# Patient Record
Sex: Female | Born: 1965 | Race: White | Hispanic: No | Marital: Married | Smoking: Current every day smoker
Health system: Southern US, Community
[De-identification: ages and names within clinical notes are randomized; demographics above are authoritative.]

## PROBLEM LIST (undated history)

## (undated) DIAGNOSIS — E785 Hyperlipidemia, unspecified: Secondary | ICD-10-CM

## (undated) DIAGNOSIS — L639 Alopecia areata, unspecified: Secondary | ICD-10-CM

## (undated) DIAGNOSIS — E119 Type 2 diabetes mellitus without complications: Secondary | ICD-10-CM

## (undated) HISTORY — DX: Type 2 diabetes mellitus without complications: E11.9

## (undated) HISTORY — DX: Hyperlipidemia, unspecified: E78.5

## (undated) HISTORY — DX: Alopecia areata, unspecified: L63.9

---

## 2021-11-21 ENCOUNTER — Encounter (INDEPENDENT_AMBULATORY_CARE_PROVIDER_SITE_OTHER): Payer: Self-pay | Admitting: *Deleted

## 2021-11-27 ENCOUNTER — Other Ambulatory Visit (HOSPITAL_COMMUNITY): Payer: Self-pay | Admitting: Family Medicine

## 2021-11-27 ENCOUNTER — Other Ambulatory Visit: Payer: Self-pay | Admitting: Family Medicine

## 2021-11-27 DIAGNOSIS — R850 Abnormal level of enzymes in specimens from digestive organs and abdominal cavity: Secondary | ICD-10-CM

## 2022-01-04 ENCOUNTER — Encounter (HOSPITAL_COMMUNITY): Payer: Self-pay | Admitting: Radiology

## 2022-01-04 ENCOUNTER — Ambulatory Visit (HOSPITAL_COMMUNITY)
Admission: RE | Admit: 2022-01-04 | Discharge: 2022-01-04 | Disposition: A | Discharge status: Home / Self Care | Payer: Federal, State, Local not specified - PPO | Source: Ambulatory Visit | Attending: Family Medicine | Admitting: Family Medicine

## 2022-01-04 DIAGNOSIS — R850 Abnormal level of enzymes in specimens from digestive organs and abdominal cavity: Secondary | ICD-10-CM | POA: Diagnosis present

## 2022-01-04 LAB — POCT I-STAT CREATININE: Creatinine, Ser: 0.8 mg/dL (ref 0.44–1.00)

## 2022-01-04 MED ORDER — IOHEXOL 300 MG/ML  SOLN
100.0000 mL | Freq: Once | INTRAMUSCULAR | Status: AC | PRN
  Administered 2022-01-04: 100 mL via INTRAVENOUS

## 2022-01-29 ENCOUNTER — Ambulatory Visit (INDEPENDENT_AMBULATORY_CARE_PROVIDER_SITE_OTHER): Payer: Federal, State, Local not specified - PPO | Admitting: Gastroenterology

## 2022-01-29 ENCOUNTER — Encounter (INDEPENDENT_AMBULATORY_CARE_PROVIDER_SITE_OTHER): Payer: Self-pay | Admitting: Gastroenterology

## 2022-01-29 VITALS — BP 97/54 | HR 95 | Temp 98.0°F | Ht 62.0 in | Wt 125.1 lb

## 2022-01-29 DIAGNOSIS — R748 Abnormal levels of other serum enzymes: Secondary | ICD-10-CM

## 2022-01-29 NOTE — Patient Instructions (Addendum)
I discussed your case with Dr. Laural Golden, he recommended We check pancreatic isoenzymes as this will help to determine further why these are elevated, though likely the elevation in your numbers is not of worrisome cause, especially given CT and US imaging are normal. I will be in touch regarding results of these labs. Please let me know if you have any new GI symptoms ?

## 2022-01-29 NOTE — Progress Notes (Signed)
? ?Referring Provider: Emelda Fear, DO ?Primary Care Physician:  Emelda Fear, DO ?Primary GI Physician: New ? ?Chief Complaint  ?Patient presents with  ? New Patient (Initial Visit)  ?  Elevated pancreatic enzyme/[place]  ? ?HPI:   ?Ellen Parrish is a 56 y.o. female with past medical history of HLD, CKD stage 2, Alopecia areata, arthritis, type 2 DM,  ? ?Patient presenting today as a new patient for elevated pancreatic enzymes. ? ?11/15/21 Lipase 103 and Amylase 182, LFTs WNL with T bili 0.4, ALk Phos 95, AST 18, ALT 10 ?Notably amylase was 151 and lipase 91 in January 2023, also with normal LFTs at that time, US done 10/26/21 with slight increased echogenicity of liver, suggestive of hepatic steatosis, no cholelithiasis or cholecystitis. Ct A/P w/contrast on 01/04/22 also without any acute abdominopelvic findings ? ?Patient denies any abdominal pain, nausea or vomiting. States that PCP was checking routine labs and found that amylase and lipase were both elevated. She has no history of pancreatic issues/pancreatitis in the past. She has not started any new medications prior to elevation in her pancreatic enzymes. Denies rectal bleeding or melena. Feels that weight is fairly stable, weighed around 132 lbs 1-2 years ago, so no significant changes in weight overall.  She reports that appetite feels somewhat decreased, however, she tells me she often feels like she does not get full when she is eating and wonders if "the food is going to her stomach." Denies nausea or vomiting. Denies constipation or diarrhea. Has a BM usually atleast once per day, sometimes could be twice a day. States that a1c was checked in February, and was 6.6. is maintained on nexium 6m, she takes this PRN, usually very occasionally as she does not have much issue with GERD symptoms. No dysphagia or odynophagia, no early satiety or bloating. Does not drink alcohol or use NSAIDs. ? ?NSAID use: usually only does tylenol on occasion  ?Social  hx:no etoh, smokes 1/2 PPD ?Fam hx: no crc, liver, or pancreatic cancer ? ?Last Colonoscopy: oct 2016, normal per patient, advised to repeat in 10 years, Dr. FMarya Landry ?Last Endoscopy:never ? ?No past medical history on file. ? ?No current outpatient medications on file.  ? ?No current facility-administered medications for this visit.  ? ? ?Allergies as of 01/29/2022  ? (Not on File)  ? ? ?No family history on file. ? ?Social History  ? ?Socioeconomic History  ? Marital status: Married  ?  Spouse name: Not on file  ? Number of children: Not on file  ? Years of education: Not on file  ? Highest education level: Not on file  ?Occupational History  ? Not on file  ?Tobacco Use  ? Smoking status: Every Day  ?  Packs/day: 0.50  ?  Types: Cigarettes  ? Smokeless tobacco: Never  ?Substance and Sexual Activity  ? Alcohol use: Not Currently  ? Drug use: Never  ? Sexual activity: Not Currently  ?Other Topics Concern  ? Not on file  ?Social History Narrative  ? Not on file  ? ?Social Determinants of Health  ? ?Financial Resource Strain: Not on file  ?Food Insecurity: Not on file  ?Transportation Needs: Not on file  ?Physical Activity: Not on file  ?Stress: Not on file  ?Social Connections: Not on file  ? ?Review of systems ?General: negative for malaise, night sweats, fever, chills, weight loss ?Neck: Negative for lumps, goiter, pain and significant neck swelling ?Resp: Negative for cough, wheezing, dyspnea at rest ?  CV: Negative for chest pain, leg swelling, palpitations, orthopnea ?GI: denies melena, hematochezia, nausea, vomiting, diarrhea, constipation, dysphagia, odyonophagia, early satiety or unintentional weight loss.  ?MSK: Negative for joint pain or swelling, back pain, and muscle pain. ?Derm: Negative for itching or rash ?Psych: Denies depression, anxiety, memory loss, confusion. No homicidal or suicidal ideation.  ?Heme: Negative for prolonged bleeding, bruising easily, and swollen nodes. ?Endocrine: Negative for cold or  heat intolerance, polyuria, polydipsia and goiter. ?Neuro: negative for tremor, gait imbalance, syncope and seizures. ?The remainder of the review of systems is noncontributory. ? ?Physical Exam: ?BP (!) 97/54 (BP Location: Left Arm, Patient Position: Sitting, Cuff Size: Normal)   Pulse 95   Temp 98 ?F (36.7 ?C) (Oral)   Ht _0  (1.575 m)   Wt 125 lb 1.6 oz (56.7 kg)   LMP  (LMP Unknown)   BMI 22.88 kg/m?  ?General:   Alert and oriented. No distress noted. Pleasant and cooperative.  ?Head:  Normocephalic and atraumatic. ?Eyes:  Conjuctiva clear without scleral icterus. ?Mouth:  Oral mucosa pink and moist. Good dentition. No lesions. ?Heart: Normal rate and rhythm, s1 and s2 heart sounds present.  ?Lungs: Clear lung sounds in all lobes. Respirations equal and unlabored. ?Abdomen:  +BS, soft, non-tender and non-distended. No rebound or guarding. No HSM or masses noted. ?Derm: No palmar erythema or jaundice ?Msk:  Symmetrical without gross deformities. Normal posture. ?Extremities:  Without edema. ?Neurologic:  Alert and  oriented x4 ?Psych:  Alert and cooperative. Normal mood and affect. ? ?Invalid input(s): 6 MONTHS  ? ?ASSESSMENT: ?Ellen Parrish is a 56 y.o. female presenting today as a new patient for elevated amylase and lipase. ? ?No abdominal pain, nausea or vomiting. No  CT or [place] evidence of pancreatitis, bile duct dilation/obstruction, cholelithiasis or cholecystitis. LFTs remain WNL. She does not drink alcohol, denies rectal bleeding or melena, no NSAID use, no suspicion for presence of PUD. Does have hx of CKD and DM which could cause potential elevation of pancreatic enzymes. ?Query Gullo's syndrome, though levels are typically elevated to three fold with this, patient's last values were Lipase 103 and Amylase 182 in February, which is not consistent with expected pattern of Gullo's. ? ?Case discussed with Dr. Laural Golden who recommended checking pancreatic enzymes for further determination, as  elevation in pancreatic enzymes is likely related to macroamylasemia, especially given patient is asymptomatic and CT and US imaging are unremarkable.  ? ?PLAN:  ?Pancreatic isoenzymes ?2. Pt to make me aware of any new GI symptoms ?3. Recommend against routine evaluation of lipase and amylase in the future  ? ?All questions were answered, patient verbalized understanding and is in agreement with plan as outlined above.  ? ?Follow Up: ?TBD ? ?Atalia Litzinger L. Alver Sorrow, MSN, APRN, AGNP-C ?Adult-Gerontology Nurse Practitioner ?[facility] ? ?

## 2022-02-03 LAB — AMYLASE ISOENZYMES
Amylase: 109 U/L — ABNORMAL HIGH (ref 21–101)
Isoamylase-Pancreatic: 106 U/L — ABNORMAL HIGH (ref 16–46)
Salivary Fraction: 3 U/L — ABNORMAL LOW (ref 4–61)

## 2022-02-06 ENCOUNTER — Other Ambulatory Visit (INDEPENDENT_AMBULATORY_CARE_PROVIDER_SITE_OTHER): Payer: Self-pay | Admitting: Gastroenterology

## 2022-02-06 DIAGNOSIS — R748 Abnormal levels of other serum enzymes: Secondary | ICD-10-CM

## 2022-02-27 ENCOUNTER — Ambulatory Visit (HOSPITAL_COMMUNITY)
Admission: RE | Admit: 2022-02-27 | Discharge: 2022-02-27 | Disposition: A | Discharge status: Home / Self Care | Payer: Federal, State, Local not specified - PPO | Source: Ambulatory Visit | Attending: Gastroenterology | Admitting: Gastroenterology

## 2022-02-27 ENCOUNTER — Other Ambulatory Visit (INDEPENDENT_AMBULATORY_CARE_PROVIDER_SITE_OTHER): Payer: Self-pay | Admitting: Gastroenterology

## 2022-02-27 DIAGNOSIS — R748 Abnormal levels of other serum enzymes: Secondary | ICD-10-CM | POA: Diagnosis present

## 2022-02-27 MED ORDER — GADOBUTROL 1 MMOL/ML IV SOLN
5.5000 mL | Freq: Once | INTRAVENOUS | Status: AC | PRN
  Administered 2022-02-27: 5.5 mL via INTRAVENOUS

## 2023-05-04 IMAGING — CT CT ABD-PELV W/ CM
3 of 6 series · 15 of 46 positions shown, 17 images · IV contrast (Omnipaque or Isovue)
Comparison: MRI pelvis June 07, 2020

CLINICAL DATA: Abnormal pancreatic enzymes.

EXAM:
CT ABDOMEN AND PELVIS WITH CONTRAST
TECHNIQUE: Multidetector CT imaging of the abdomen and pelvis was performed
using the standard protocol following bolus administration of
intravenous contrast.

[Series 4: lung bases · axial · 0.72mm/px · z∈[+1329,+1374]mm · 2 of 27 slices shown]
[im 9/27  bone]
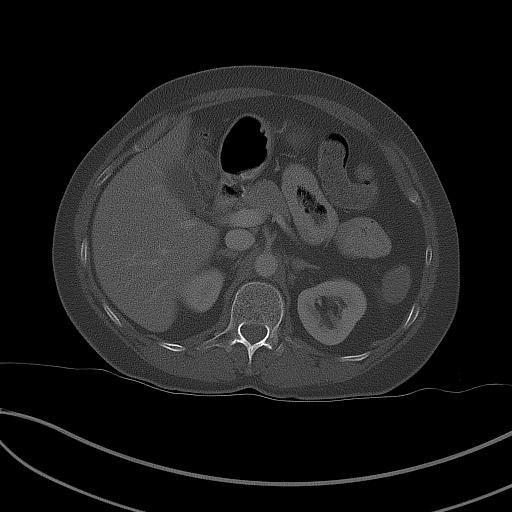
[im 18/27  bone]
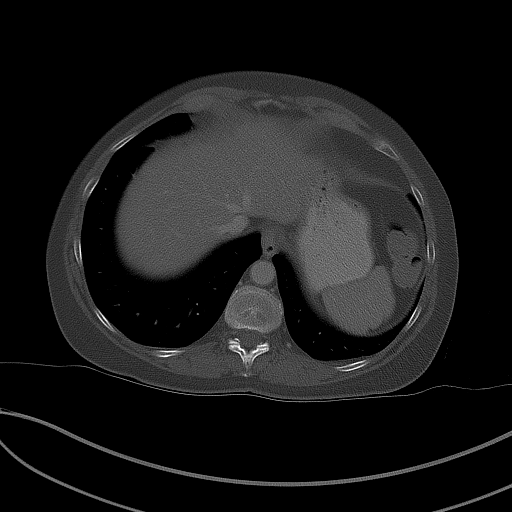

[Series 5: coronal st · coronal · 0.67mm/px · 3 of 94 slices shown]
[im 32/94  soft-tissue]
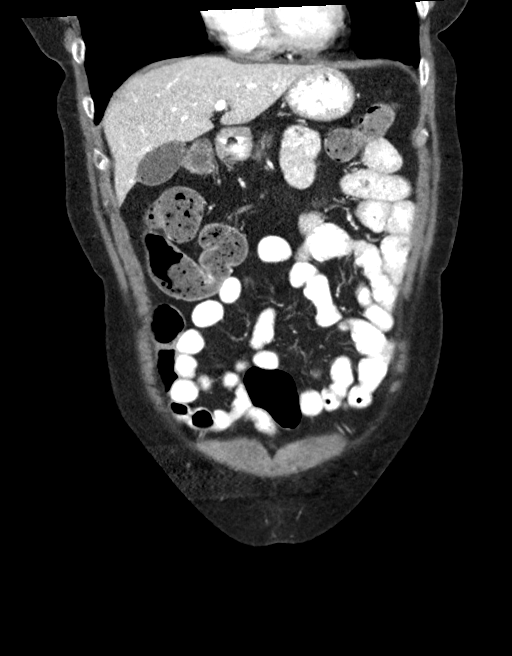
[im 42/94  soft-tissue]
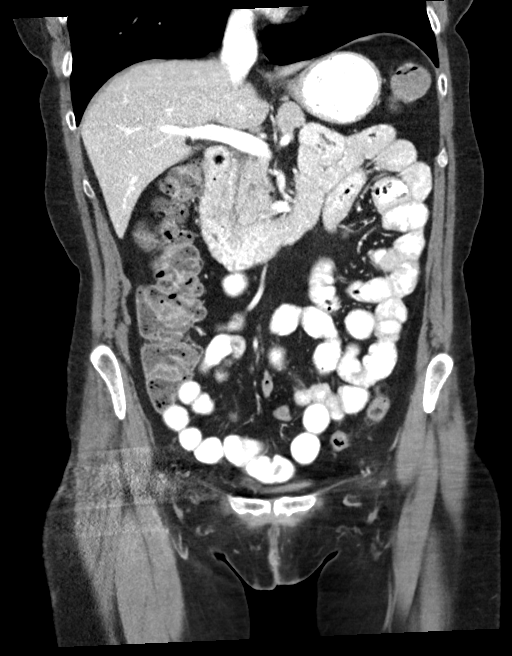
[im 52/94  soft-tissue]
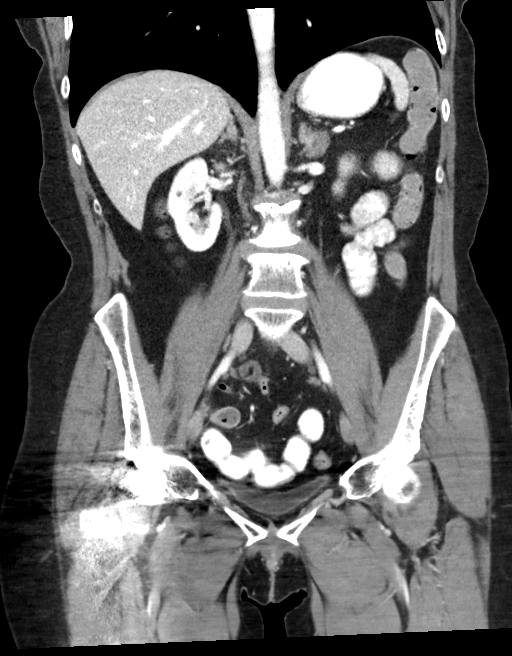

[Series 8: axial (person_name) · axial · 0.72mm/px · z∈[+1034,+1379]mm · 10 of 85 slices shown, 12 images]
[im 8/85  soft-tissue]
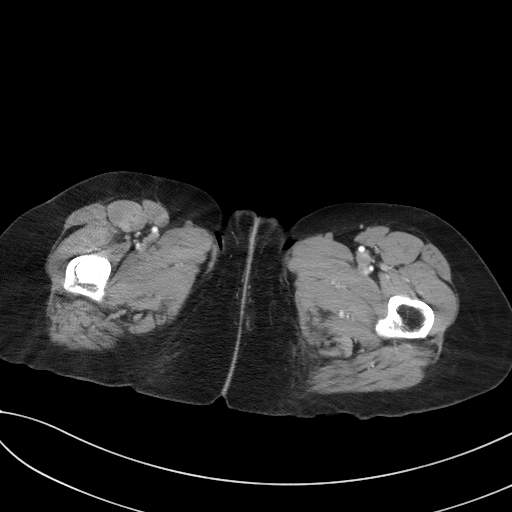
[im 8/85  bone]
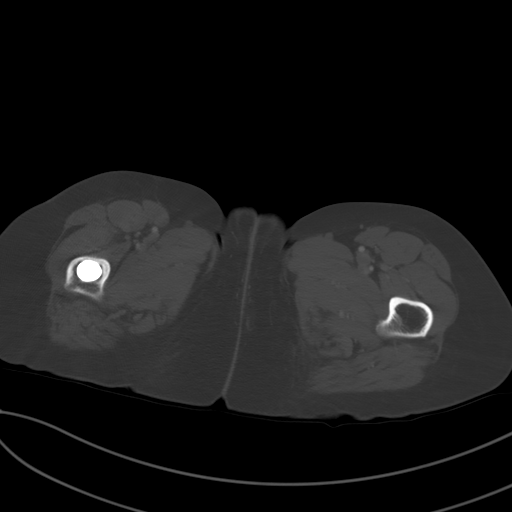
[im 16/85  soft-tissue]
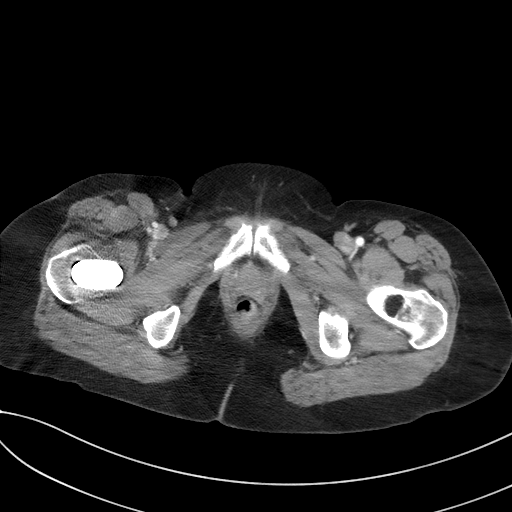
[im 23/85  soft-tissue]
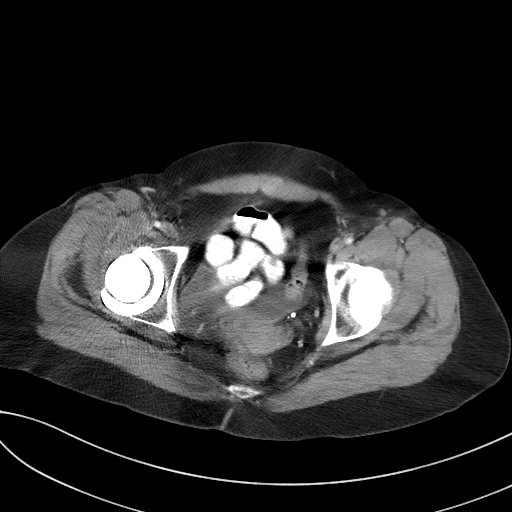
[im 31/85  soft-tissue]
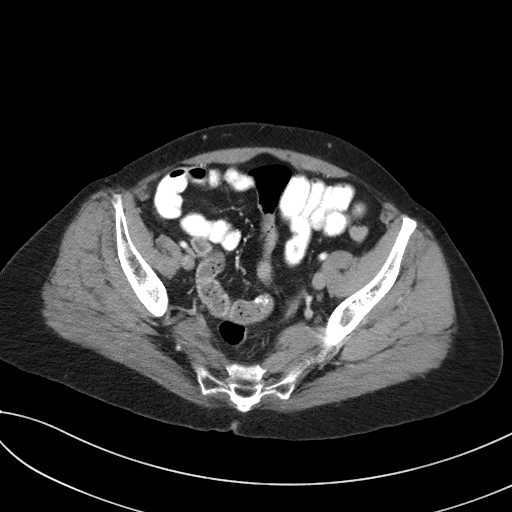
[im 39/85  soft-tissue]
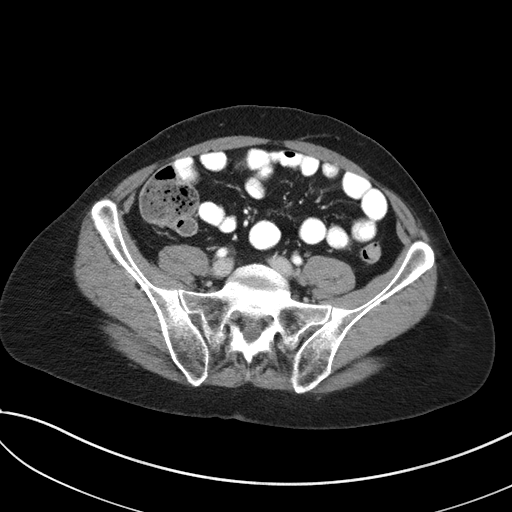
[im 46/85  soft-tissue]
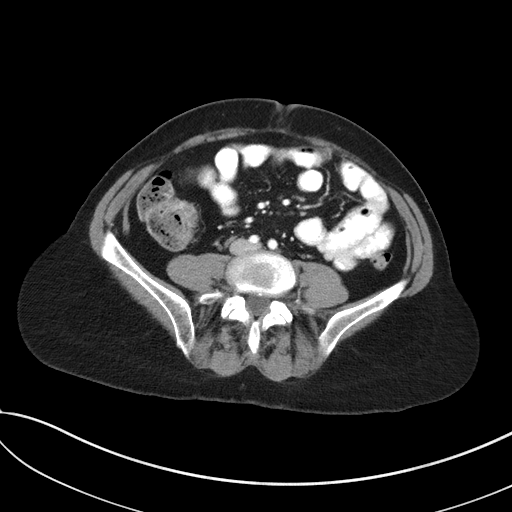
[im 54/85  soft-tissue]
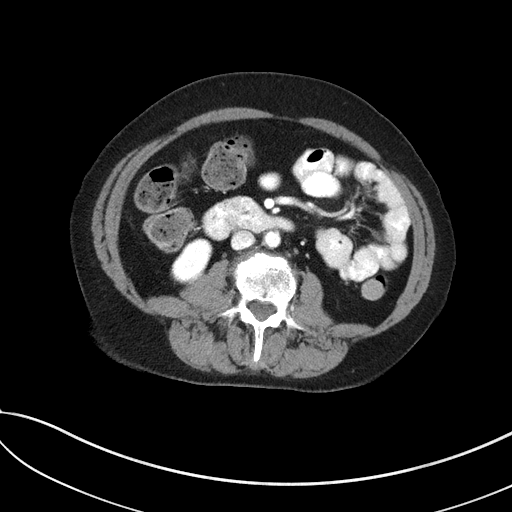
[im 62/85  soft-tissue]
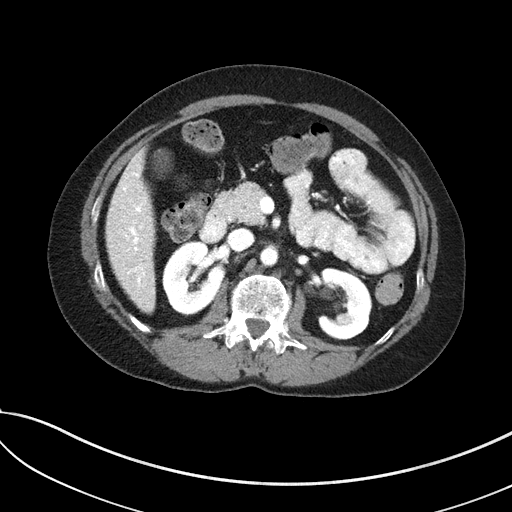
[im 69/85  soft-tissue]
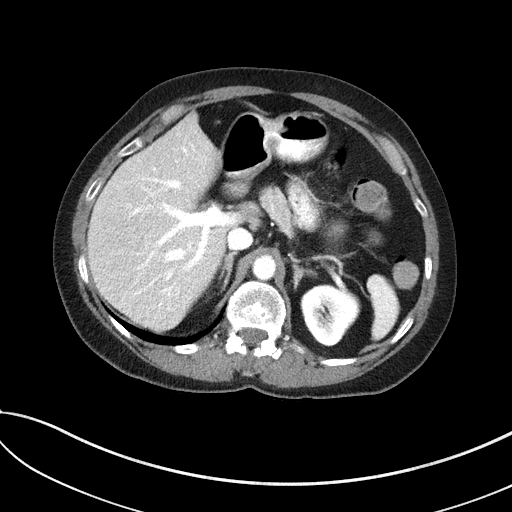
[im 69/85  bone]
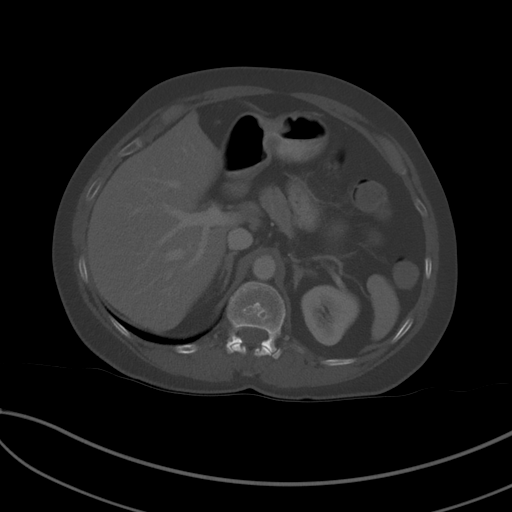
[im 77/85  soft-tissue]
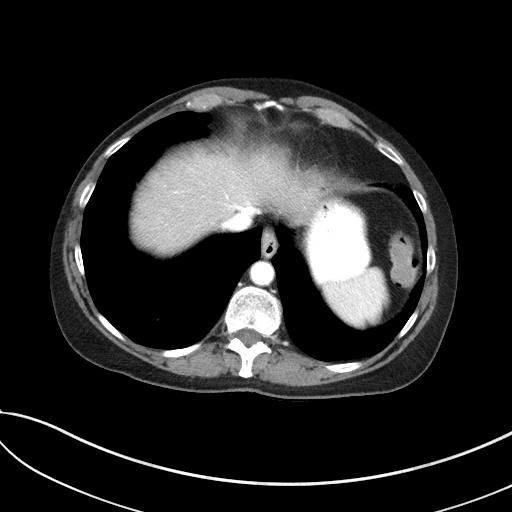

[15 of 46 positions shown; findings below may reference images not displayed]

RADIATION DOSE REDUCTION: This exam was performed according to the
departmental dose-optimization program which includes automated
exposure control, adjustment of the mA and/or kV according to
patient size and/or use of iterative reconstruction technique.

CONTRAST:  100mL OMNIPAQUE IOHEXOL 300 MG/ML  SOLN
FINDINGS: Lower chest: No acute abnormality.

Hepatobiliary: No suspicious hepatic lesion. Gallbladder is
unremarkable. No biliary ductal dilation.

Pancreas: No pancreatic ductal dilation or evidence of acute
inflammation.

Spleen: No splenomegaly or focal splenic lesion.

Adrenals/Urinary Tract: Bilateral adrenal glands are within normal
limits. No hydronephrosis. Kidneys demonstrate symmetric enhancement
excretion of contrast. Urinary bladder is minimally distended
limiting evaluation.

Stomach/Bowel: Radiopaque enteric contrast material traverses distal
loops of small bowel. Stomach is unremarkable for degree of
distension. No pathologic dilation of small or large bowel. The
appendix and terminal ileum appear normal. Moderate volume of formed
stool throughout the colon suggestive of constipation.

Vascular/Lymphatic: Aortic atherosclerosis without abdominal aortic
aneurysm. No pathologically enlarged abdominal or pelvic lymph
nodes.

Reproductive: Uterus and bilateral adnexa are unremarkable.

Other: No significant abdominopelvic free fluid.

Musculoskeletal: Right hip arthroplasty. Multilevel degenerative
changes spine. No acute osseous abnormality.
IMPRESSION: 1. No acute abdominopelvic findings. Specifically, no evidence of
acute pancreatitis.
2. Moderate volume of formed stool throughout the colon suggestive
of constipation.
3.  Aortic Atherosclerosis ([id number].S).
